# Patient Record
Sex: Female | Born: 1995 | Race: White | Hispanic: No | Marital: Single | Smoking: Never smoker
Health system: Southern US, Community
[De-identification: ages and names within clinical notes are randomized; demographics above are authoritative.]

---
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
## 2022-01-14 ENCOUNTER — Ambulatory Visit
Admission: EM | Admit: 2022-01-14 | Discharge: 2022-01-14 | Disposition: A | Discharge status: Home / Self Care | Payer: Managed Care, Other (non HMO)

## 2022-01-14 ENCOUNTER — Encounter: Payer: Self-pay | Admitting: Emergency Medicine

## 2022-01-14 ENCOUNTER — Other Ambulatory Visit: Payer: Self-pay

## 2022-01-14 ENCOUNTER — Emergency Department
Admission: EM | Admit: 2022-01-14 | Discharge: 2022-01-14 | Disposition: A | Discharge status: Home / Self Care | Payer: Managed Care, Other (non HMO) | Attending: Emergency Medicine | Admitting: Emergency Medicine

## 2022-01-14 ENCOUNTER — Emergency Department: Payer: Managed Care, Other (non HMO)

## 2022-01-14 DIAGNOSIS — R1030 Lower abdominal pain, unspecified: Secondary | ICD-10-CM

## 2022-01-14 DIAGNOSIS — Y92410 Unspecified street and highway as the place of occurrence of the external cause: Secondary | ICD-10-CM | POA: Diagnosis not present

## 2022-01-14 DIAGNOSIS — S30811A Abrasion of abdominal wall, initial encounter: Secondary | ICD-10-CM | POA: Diagnosis not present

## 2022-01-14 DIAGNOSIS — S3991XA Unspecified injury of abdomen, initial encounter: Secondary | ICD-10-CM | POA: Diagnosis present

## 2022-01-14 DIAGNOSIS — S3981XA Other specified injuries of abdomen, initial encounter: Secondary | ICD-10-CM

## 2022-01-14 LAB — POC URINE PREG, ED: Preg Test, Ur: NEGATIVE

## 2022-01-14 MED ORDER — IOHEXOL 300 MG/ML  SOLN
80.0000 mL | Freq: Once | INTRAMUSCULAR | Status: AC | PRN
  Administered 2022-01-14: 80 mL via INTRAVENOUS

## 2022-01-14 NOTE — ED Notes (Signed)
Patient is being discharged from the Urgent Care and sent to the Emergency Department via private vehicle per Ignacia Bayley, PA . Per Ignacia Bayley PA, patient is in need of higher level of care due to severe ABD pain. Patient is aware and verbalizes understanding of plan of care.  Severe abdominal pain
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
## 2022-01-14 NOTE — Discharge Instructions (Addendum)
-  Sent to ED via POV ?-Declines AVS ?
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
